# Patient Record
Sex: Female | Born: 1975 | Race: Black or African American | Hispanic: No | Marital: Single
Health system: Southern US, Community
[De-identification: ages and names within clinical notes are randomized; demographics above are authoritative.]

---

## 2010-08-08 ENCOUNTER — Emergency Department: Payer: Self-pay | Admitting: Emergency Medicine

## 2011-12-23 IMAGING — CR DG FOOT COMPLETE 3+V*L*
1 series · 3 of 3 positions shown · non-contrast
Comparison: none

REASON FOR EXAM: pain swelling s/p WC injury
COMMENTS:   LMP: Three weeks ago

PROCEDURE:     DXR - DXR FOOT LT COMP W/OBLIQUES  - August 08, 2010 [DATE]
RESULT:     Images of the left foot demonstrate no fracture, dislocation or
radiopaque foreign body.

[Series 1: view not recorded · 0.17mm/px · 3 of 3 slices shown]
[im 1/3]
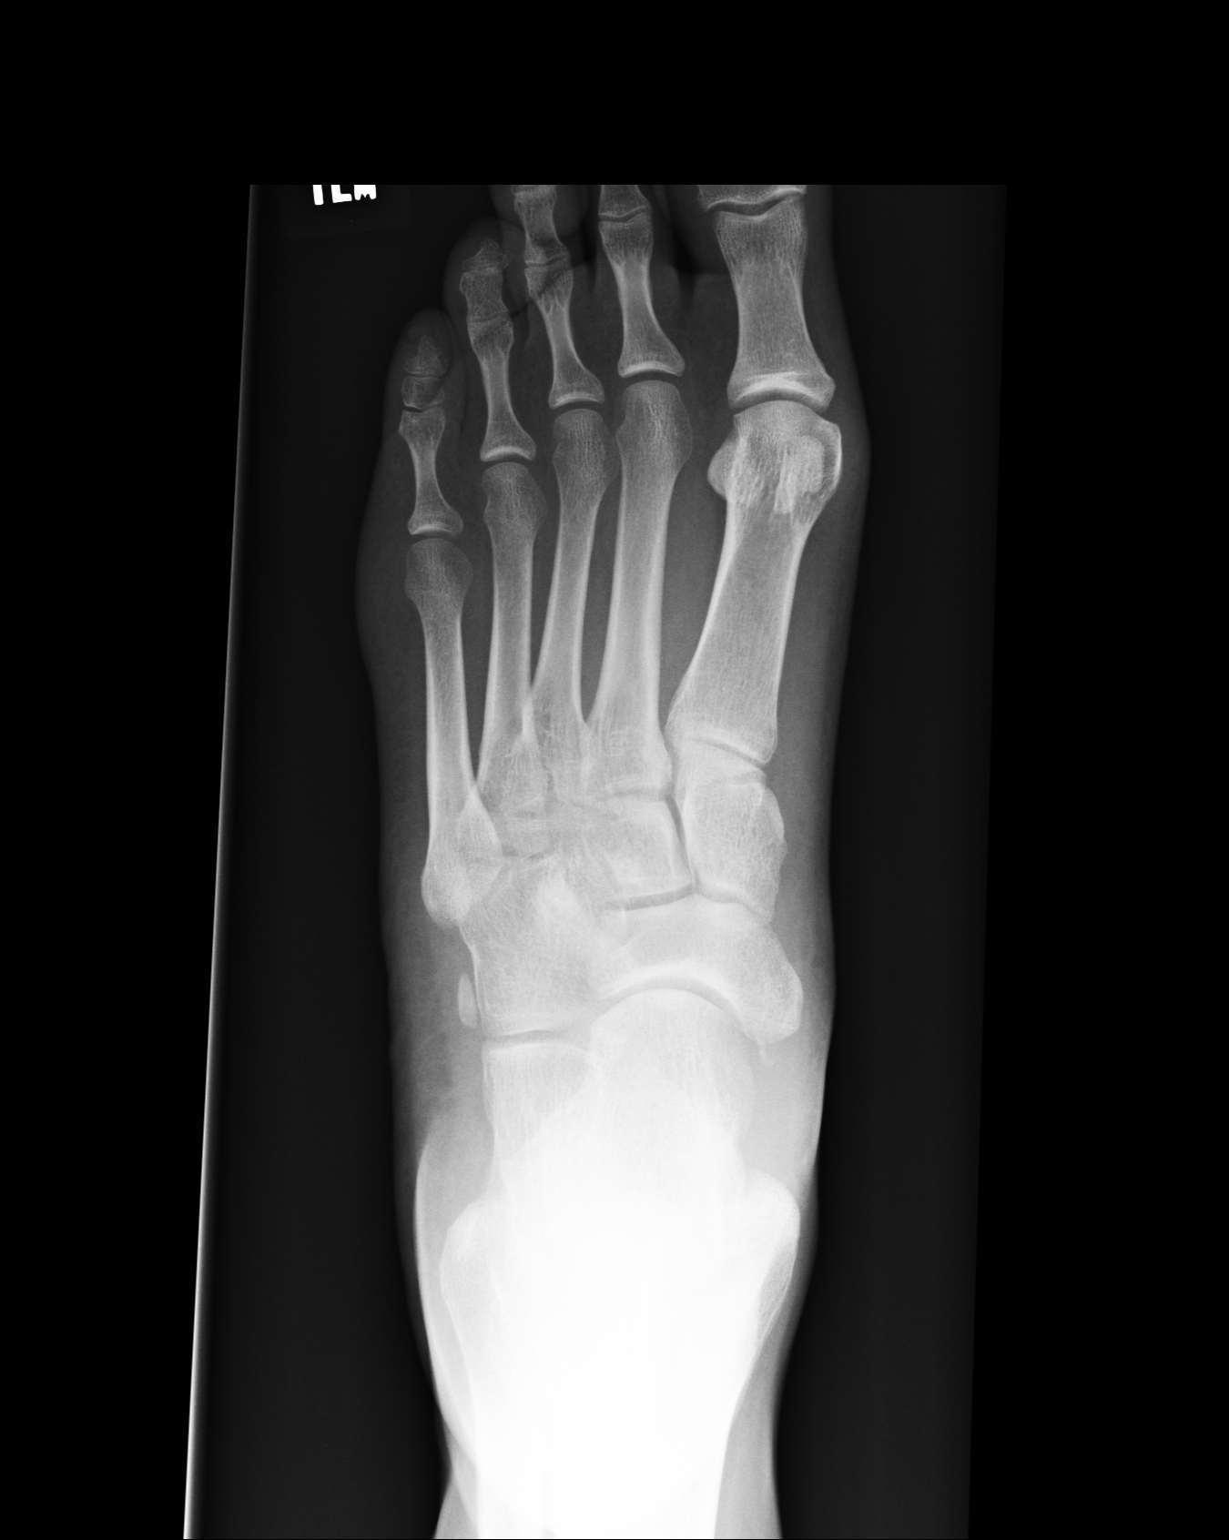
[im 2/3]
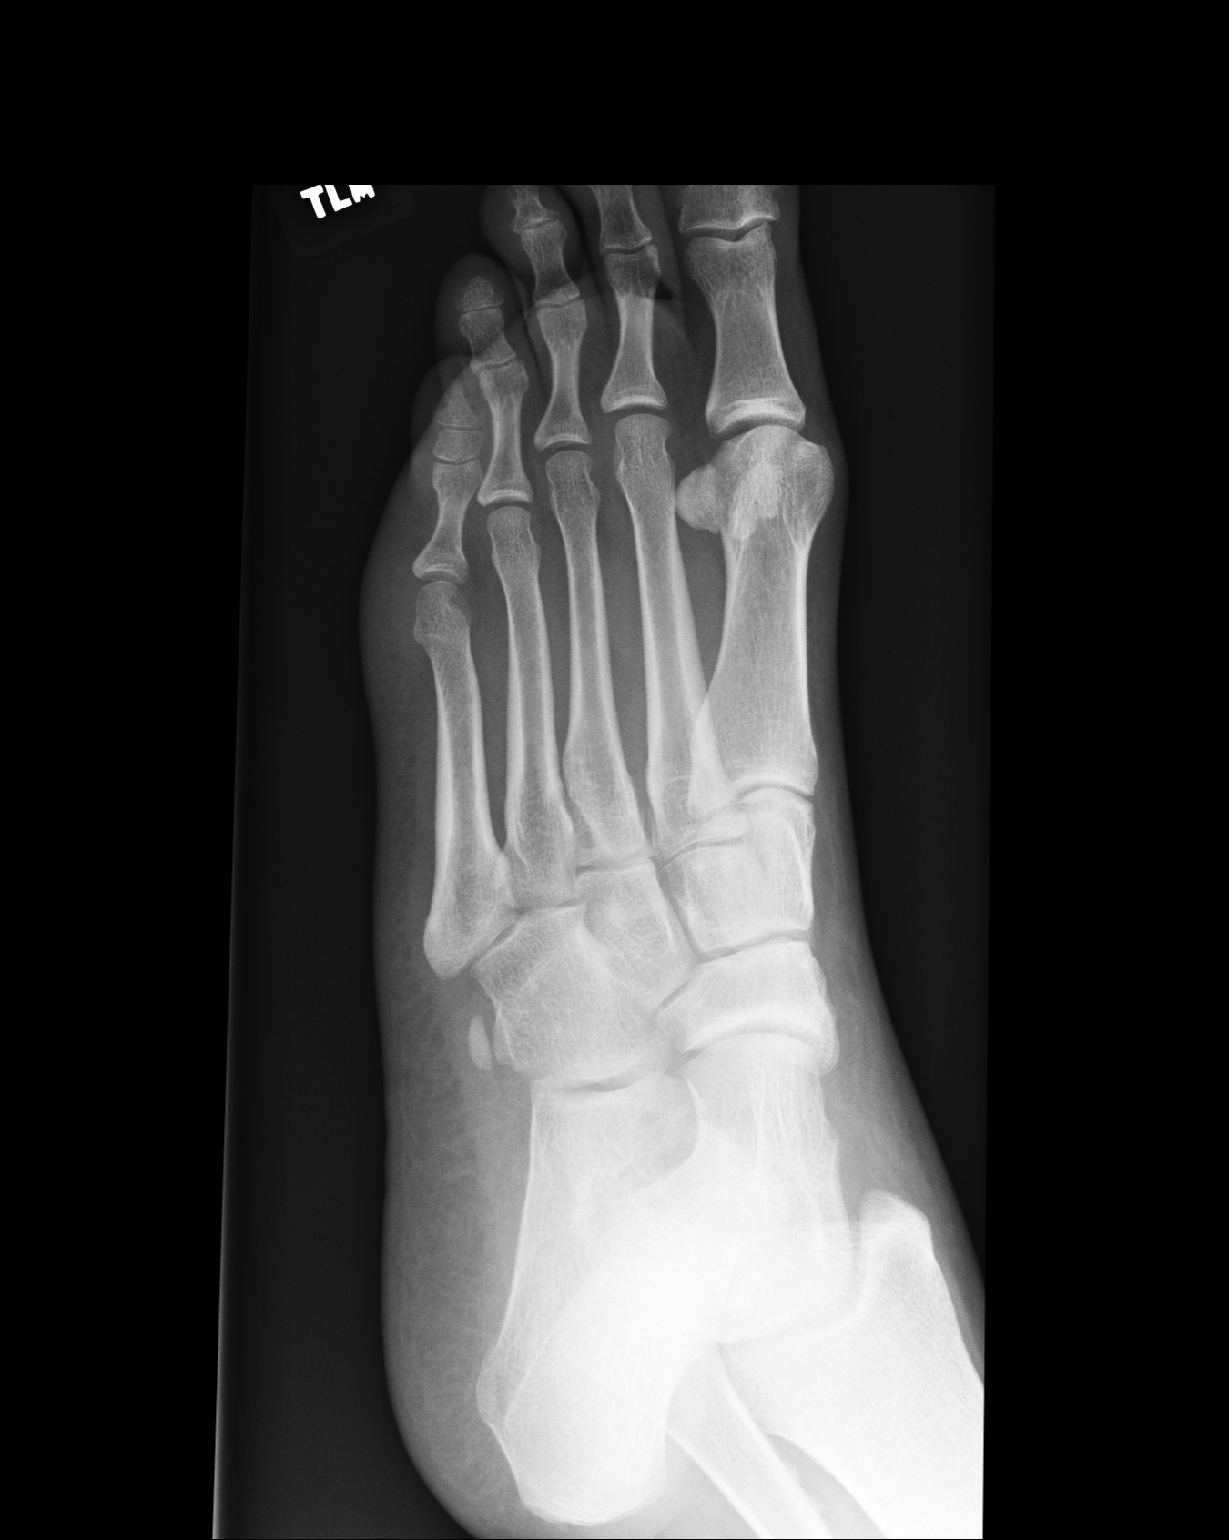
[im 3/3]
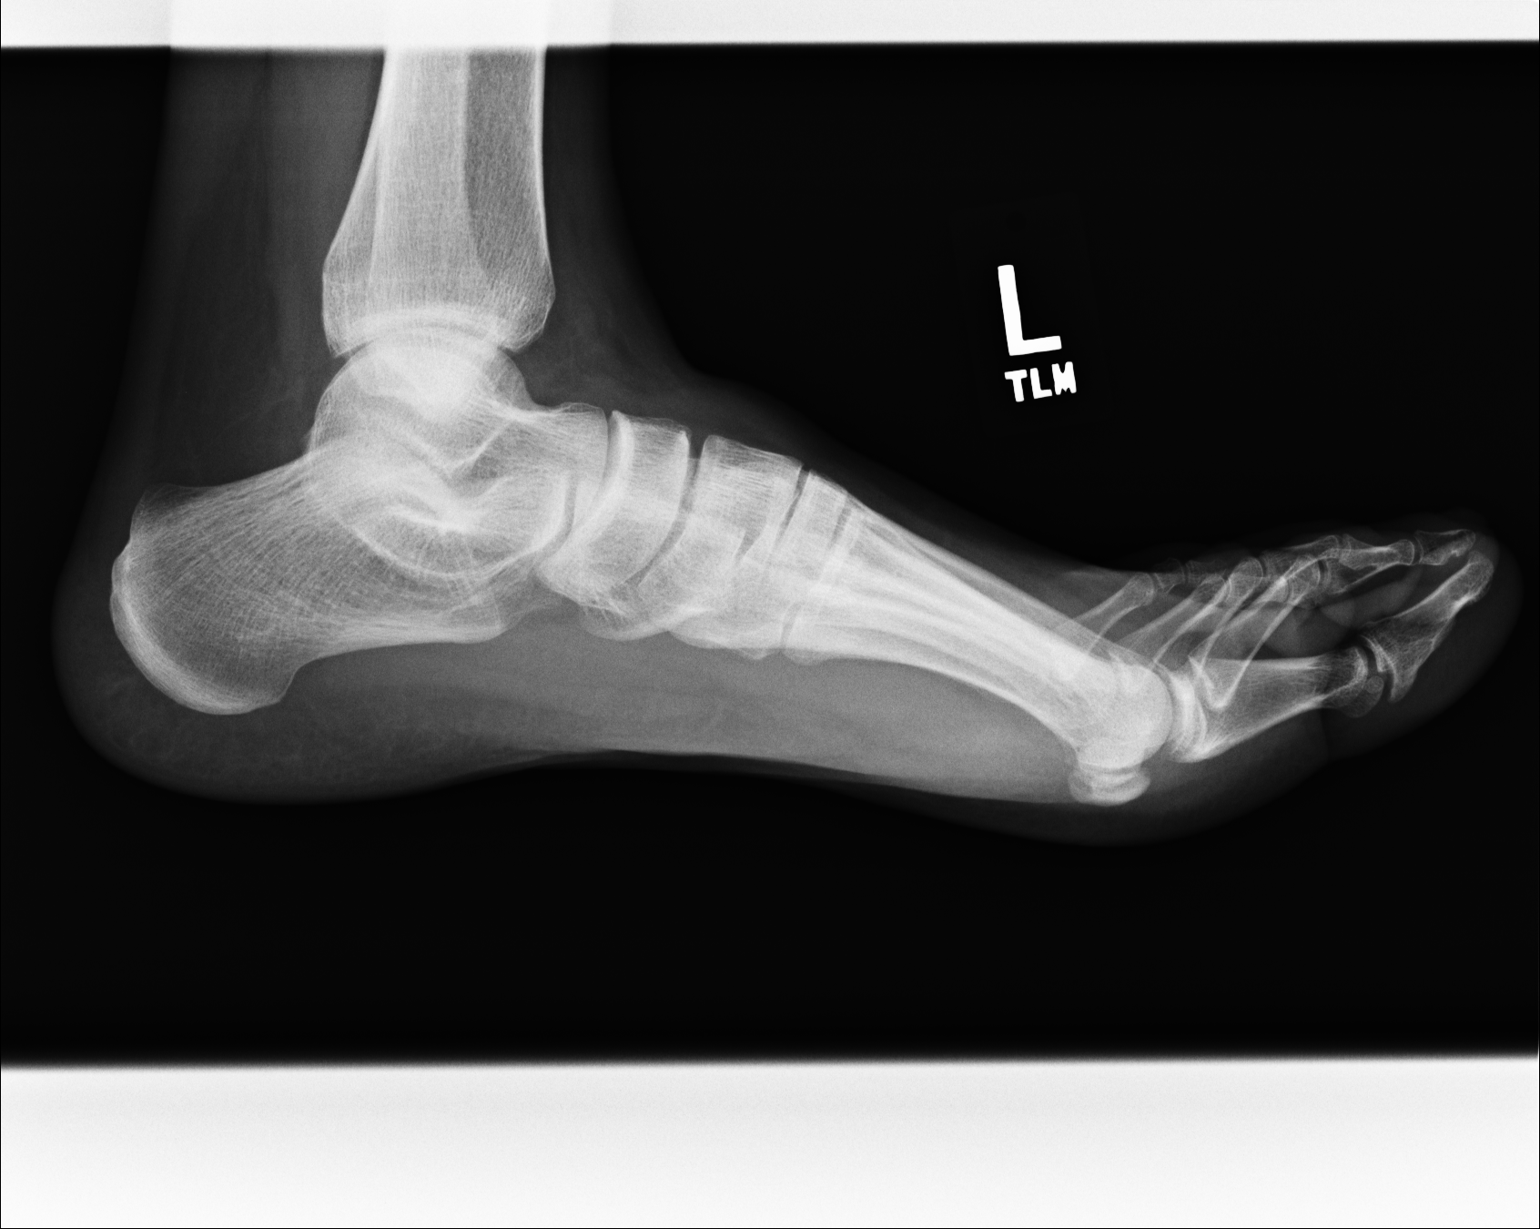

[3 of 3 positions shown; findings below may reference images not displayed]

IMPRESSION: Please see above.

## 2013-04-13 ENCOUNTER — Encounter: Payer: Self-pay | Admitting: Obstetrics and Gynecology

## 2013-04-17 ENCOUNTER — Encounter: Payer: Self-pay | Admitting: Obstetrics & Gynecology

## 2013-04-20 ENCOUNTER — Ambulatory Visit: Payer: Self-pay | Admitting: Obstetrics & Gynecology

## 2013-05-25 ENCOUNTER — Encounter: Payer: Self-pay | Admitting: Maternal & Fetal Medicine

## 2013-10-20 ENCOUNTER — Inpatient Hospital Stay: Payer: Self-pay | Admitting: Obstetrics and Gynecology

## 2013-10-20 LAB — CBC WITH DIFFERENTIAL/PLATELET
BASOS ABS: 0 10*3/uL (ref 0.0–0.1)
Basophil %: 0.5 %
Eosinophil #: 0 10*3/uL (ref 0.0–0.7)
Eosinophil %: 0.7 %
HCT: 39 % (ref 35.0–47.0)
HGB: 12.7 g/dL (ref 12.0–16.0)
Lymphocyte #: 1.5 10*3/uL (ref 1.0–3.6)
Lymphocyte %: 31.5 %
MCH: 26.9 pg (ref 26.0–34.0)
MCHC: 32.7 g/dL (ref 32.0–36.0)
MCV: 82 fL (ref 80–100)
Monocyte #: 0.5 x10 3/mm (ref 0.2–0.9)
Monocyte %: 10.2 %
NEUTROS ABS: 2.8 10*3/uL (ref 1.4–6.5)
Neutrophil %: 57.1 %
Platelet: 228 10*3/uL (ref 150–440)
RBC: 4.73 10*6/uL (ref 3.80–5.20)
RDW: 13.8 % (ref 11.5–14.5)
WBC: 4.9 10*3/uL (ref 3.6–11.0)

## 2013-10-22 LAB — HEMATOCRIT: HCT: 38.1 % (ref 35.0–47.0)

## 2014-08-28 IMAGING — US US OB NUCHAL TRANSLUCENCY 1ST GEST - [facility] NRPT
1 series · 14 of 28 positions shown · non-contrast
Comparison: none

[Series 1: us ob nuchal translucency 1st gest - [facility] nrpt · 0.28mm/px · 14 of 70 slices shown]
[im 3/70]
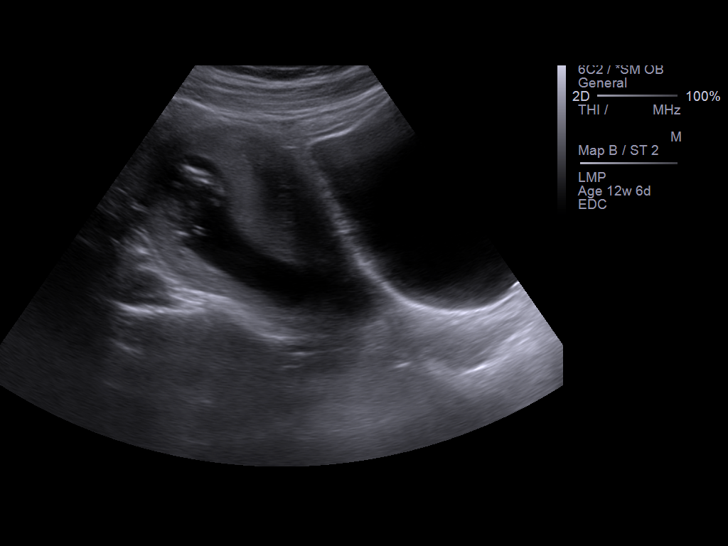
[im 8/70]
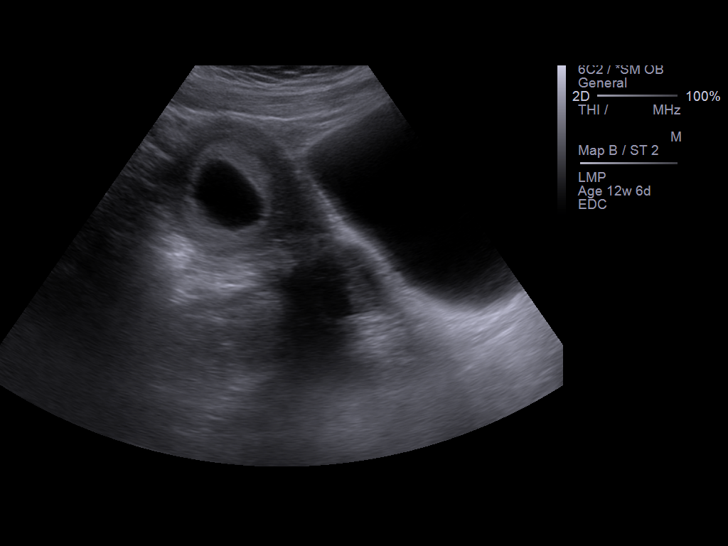
[im 13/70]
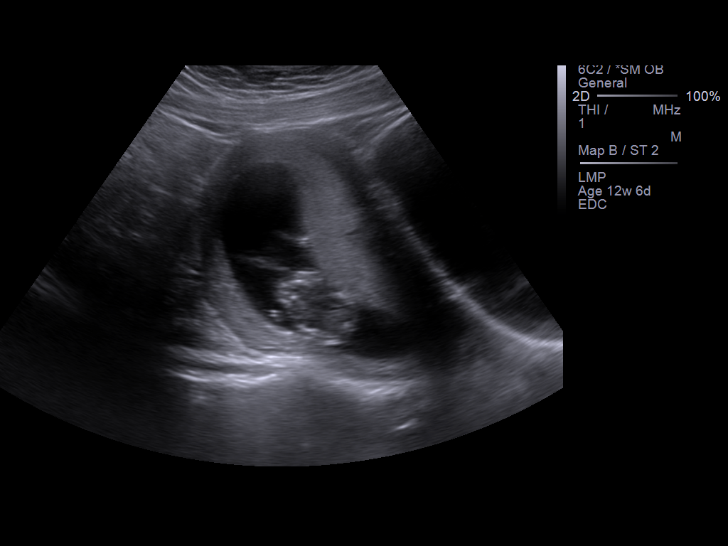
[im 18/70]
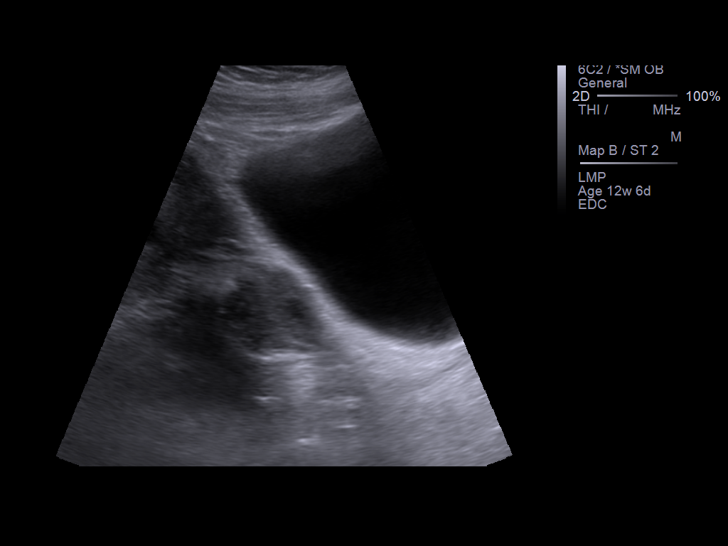
[im 24/70]
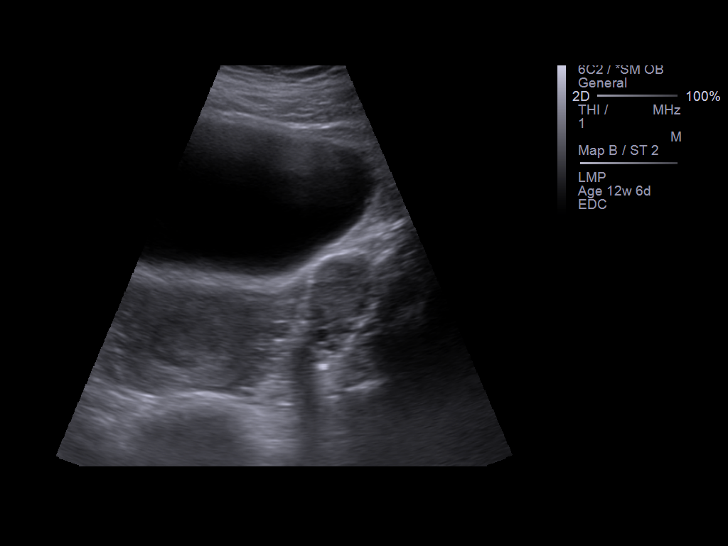
[im 29/70]
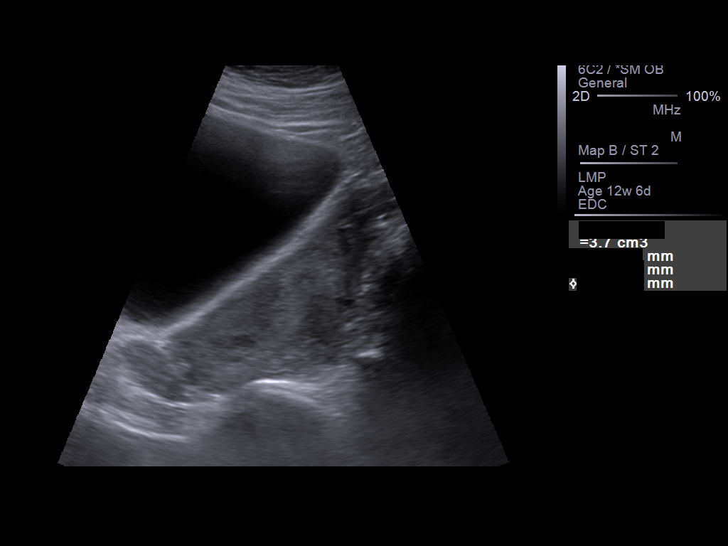
[im 34/70]
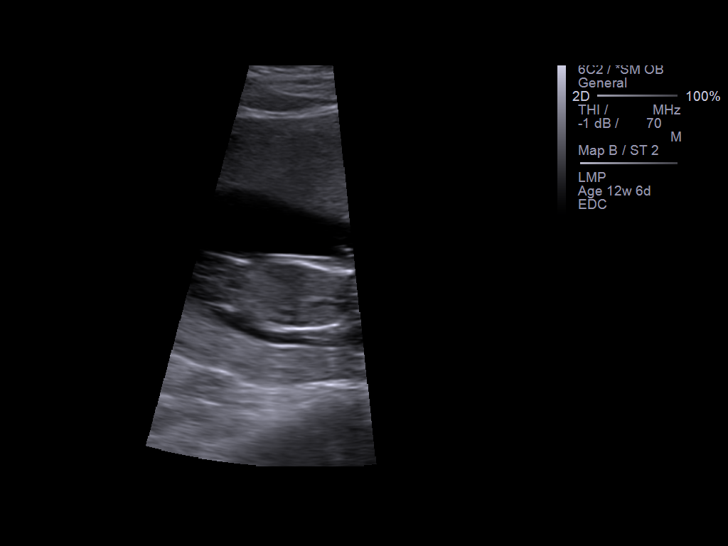
[im 39/70]
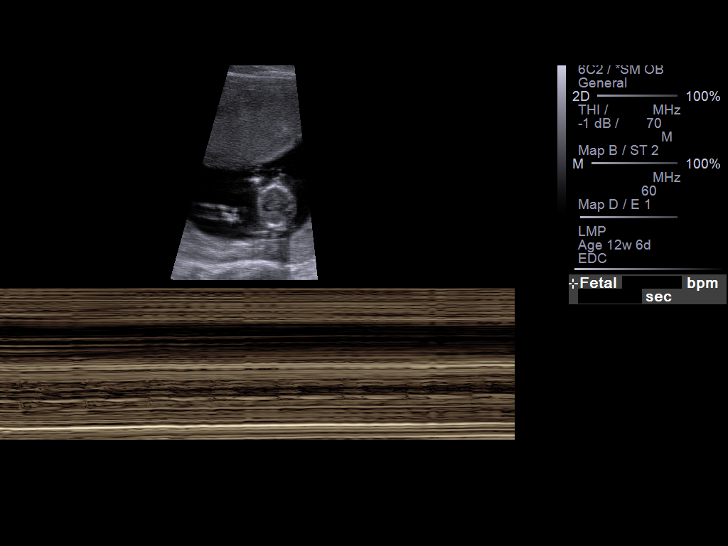
[im 44/70]
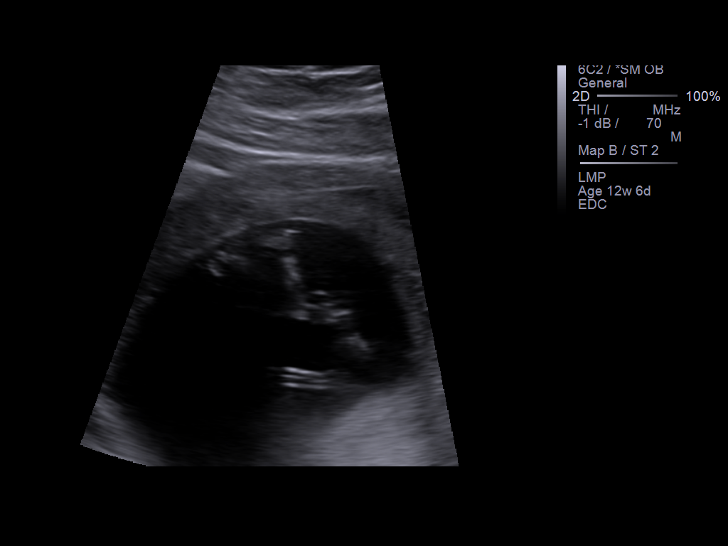
[im 49/70]
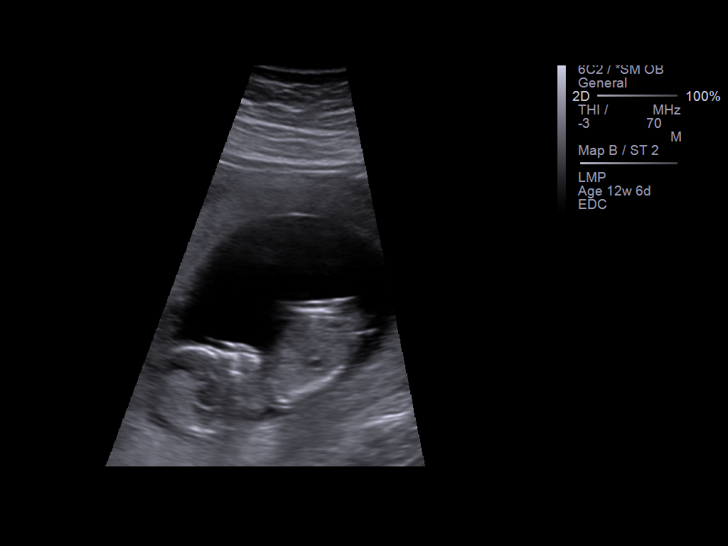
[im 54/70]
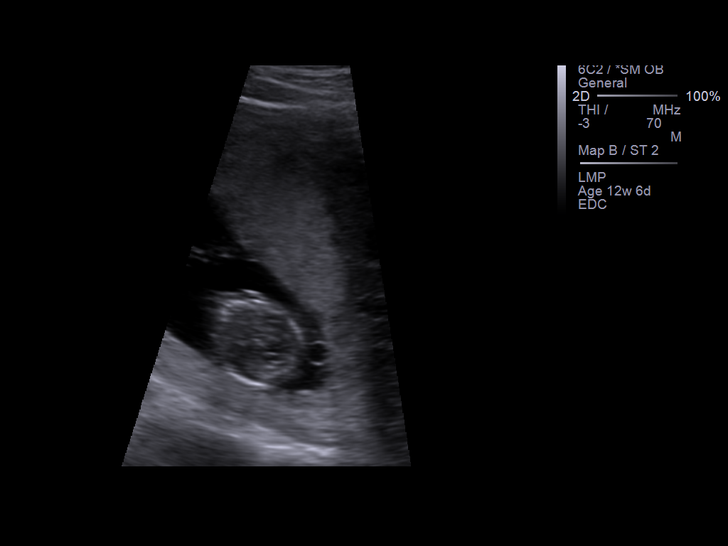
[im 59/70]
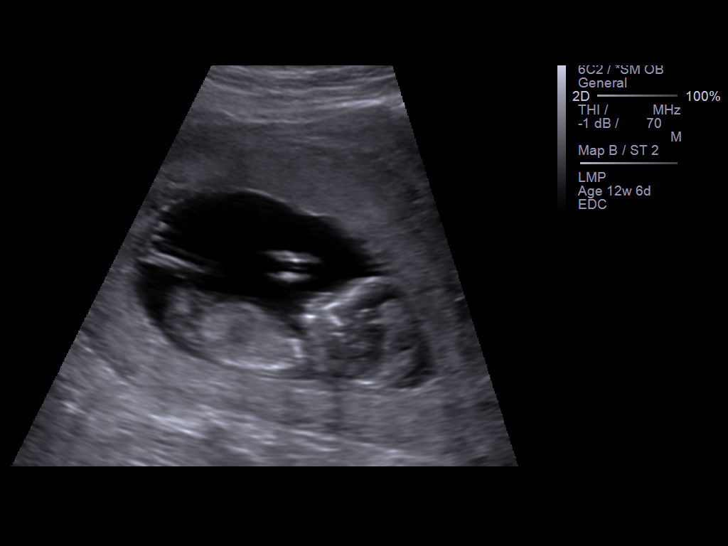
[im 64/70]
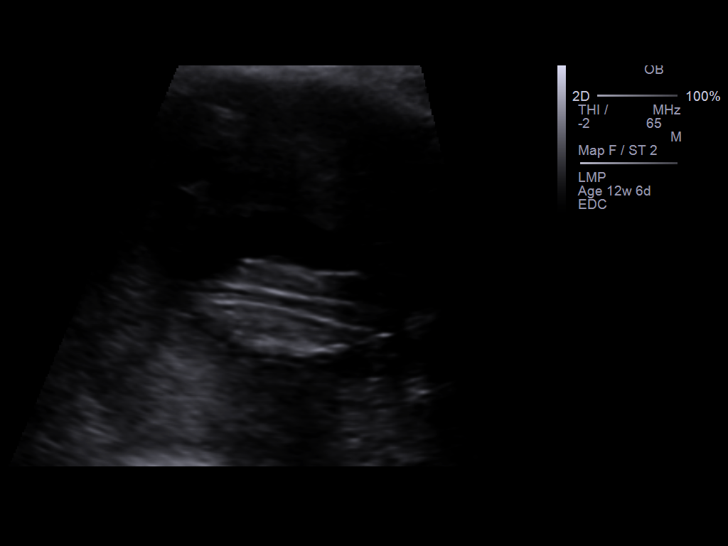
[im 70/70]
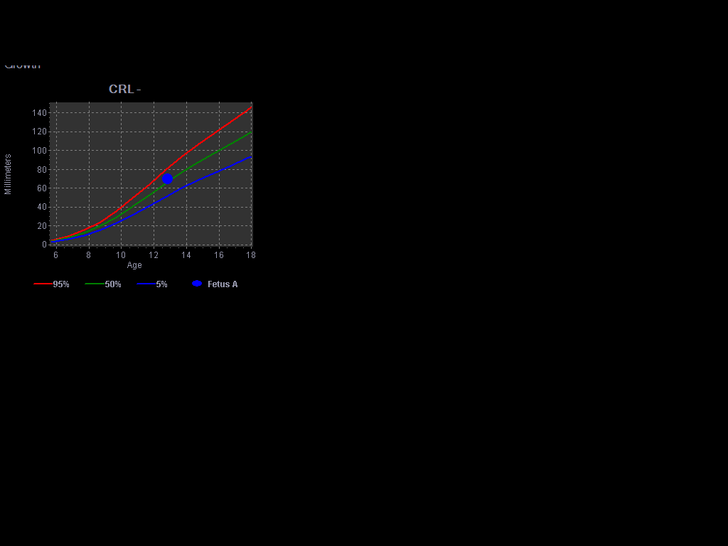

[14 of 28 positions shown; findings below may reference images not displayed]

IMAGES IMPORTED FROM THE [employer] WORKFLOW SYSTEM
NO DICTATION FOR STUDY

## 2014-09-01 IMAGING — US US OB NUCHAL TRANSLUCENCY 1ST GEST - [facility] NRPT
1 series · 14 of 24 positions shown · non-contrast
Comparison: none

[Series 1: us ob nuchal translucency 1st gest - [facility] nrpt · 0.21mm/px · 14 of 24 slices shown]
[im 1/24]
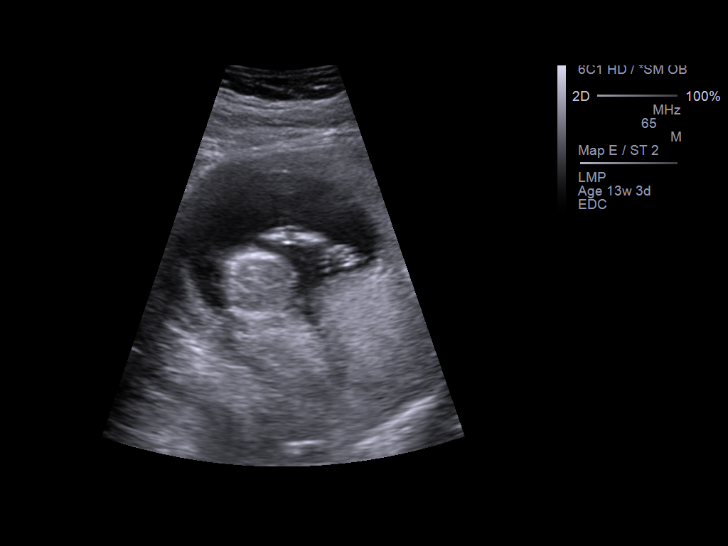
[im 3/24]
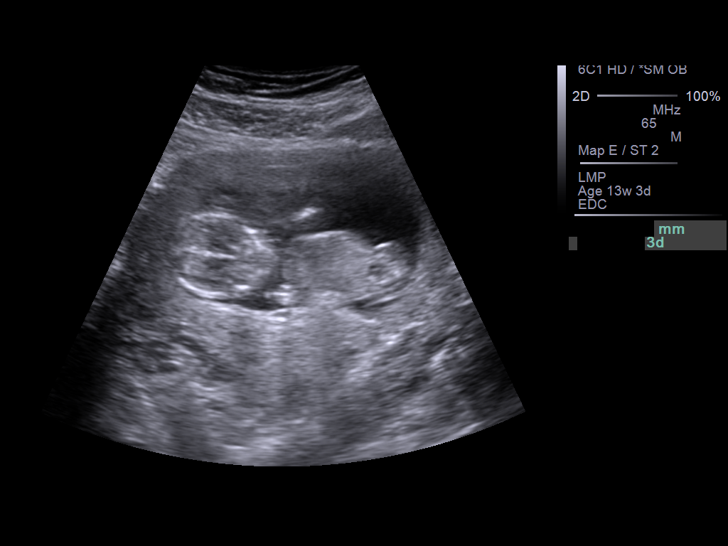
[im 5/24]
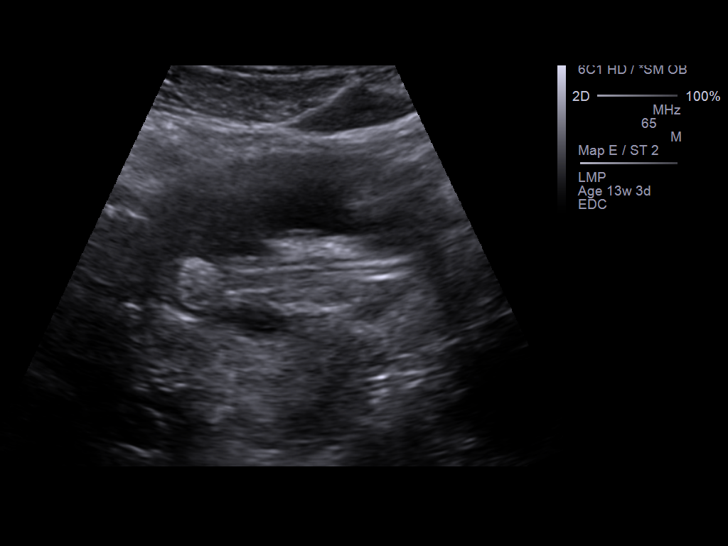
[im 7/24]
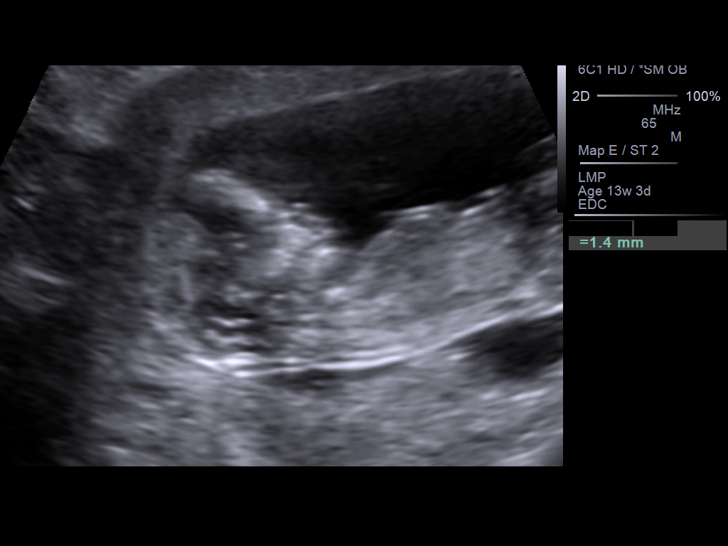
[im 8/24]
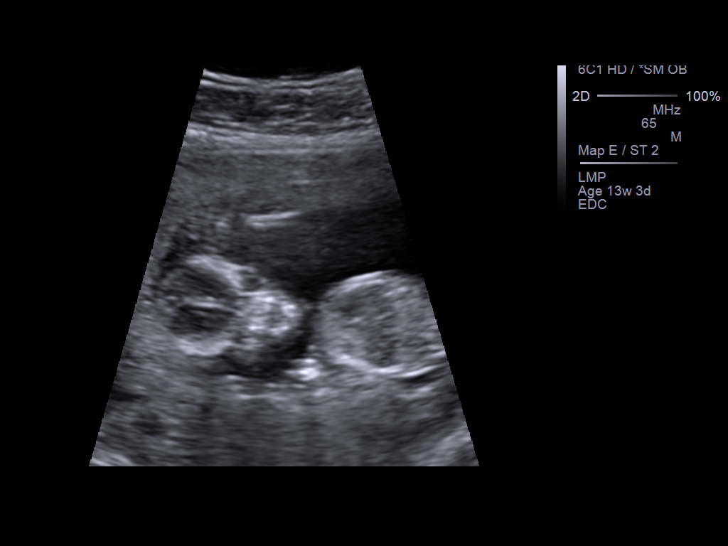
[im 10/24]
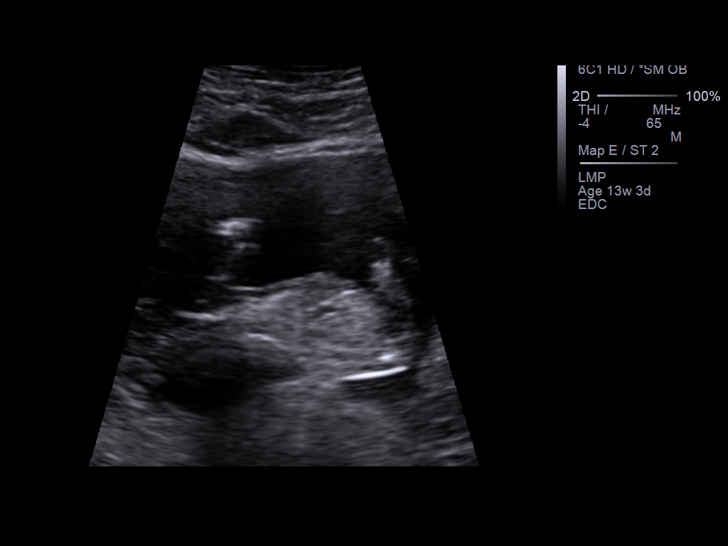
[im 12/24]
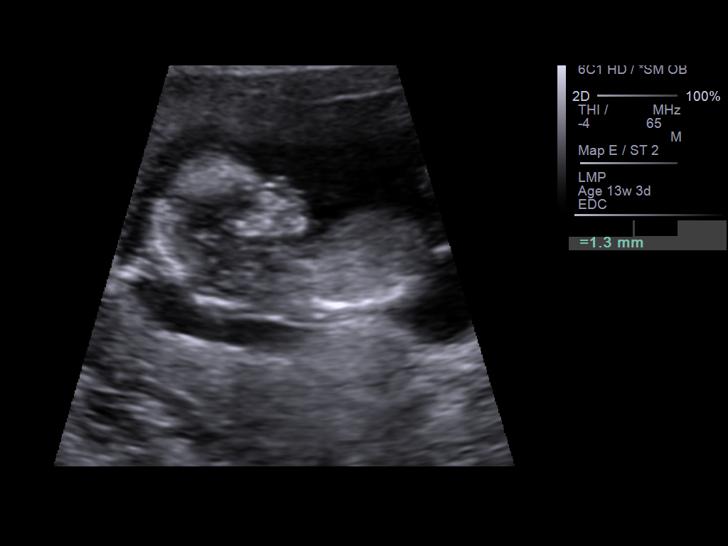
[im 13/24]
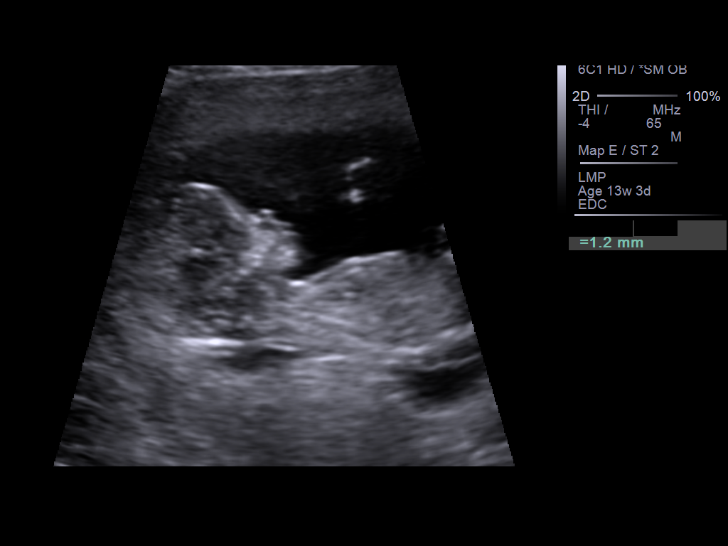
[im 15/24]
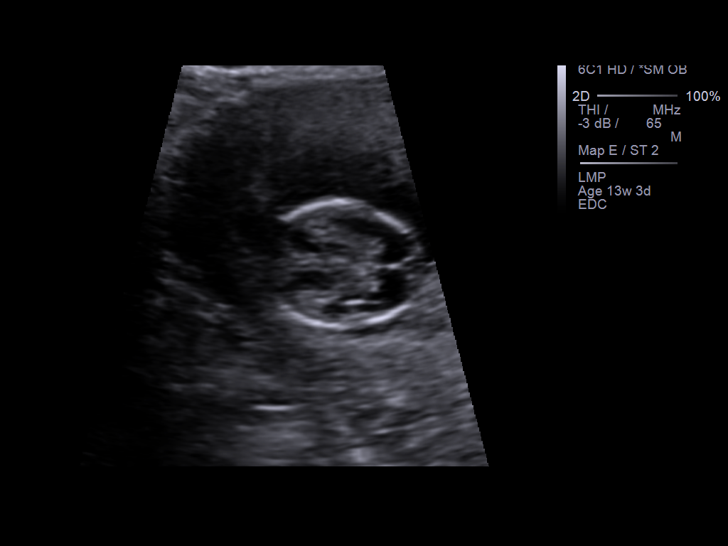
[im 17/24]
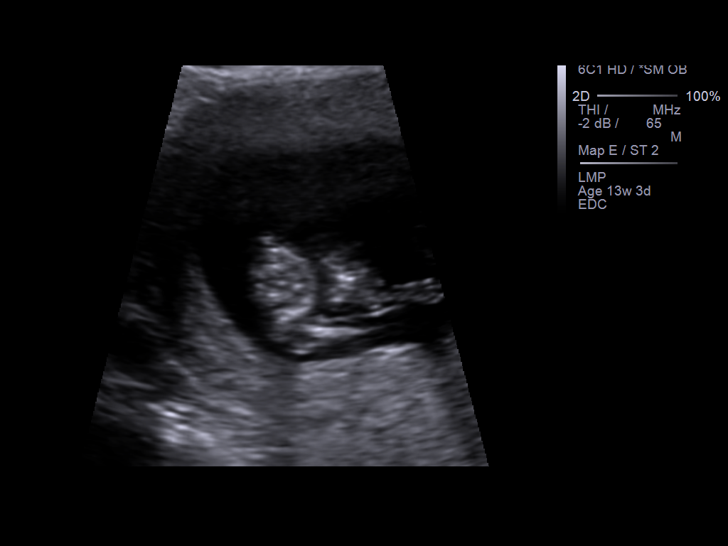
[im 19/24]
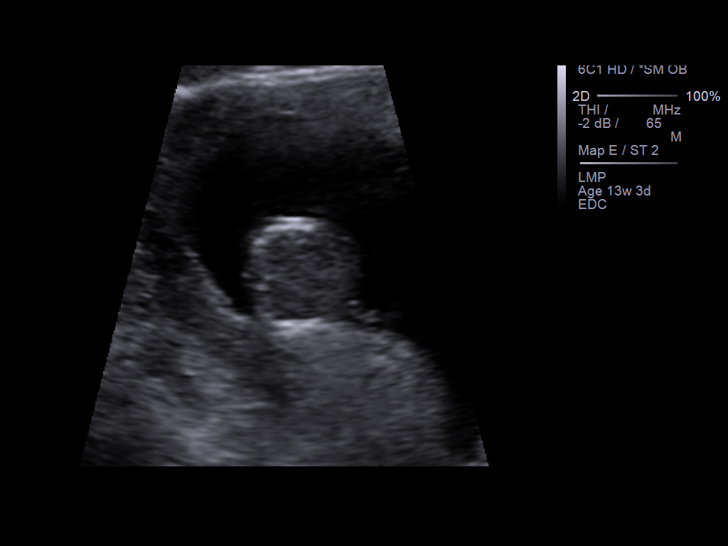
[im 20/24]
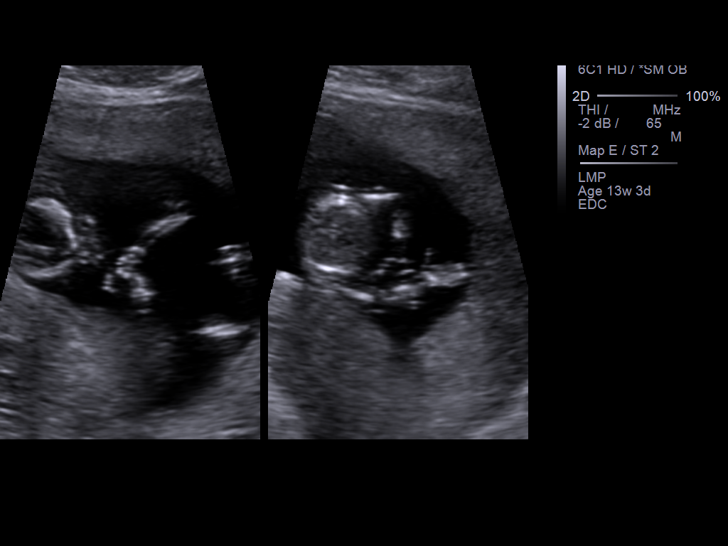
[im 22/24]
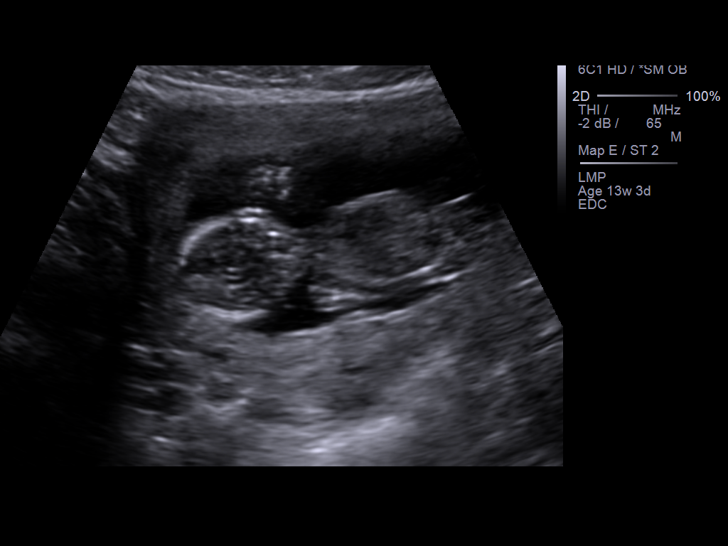
[im 24/24]
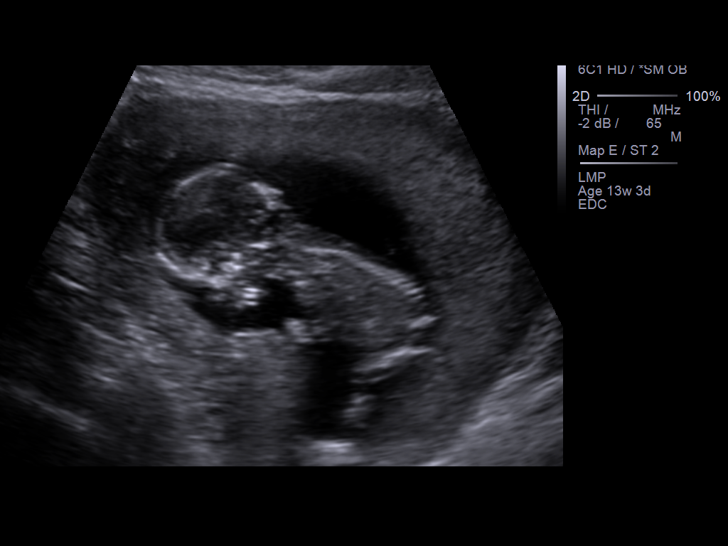

[14 of 24 positions shown; findings below may reference images not displayed]

IMAGES IMPORTED FROM THE [employer] WORKFLOW SYSTEM
NO DICTATION FOR STUDY

## 2014-10-09 IMAGING — US US OB DETAIL+14 WK - NRPT [facility]
1 series · 14 of 28 positions shown · non-contrast
Comparison: none

[Series 1: us ob detail+14 wk - nrpt [facility] · 0.23mm/px · 14 of 101 slices shown]
[im 4/101]
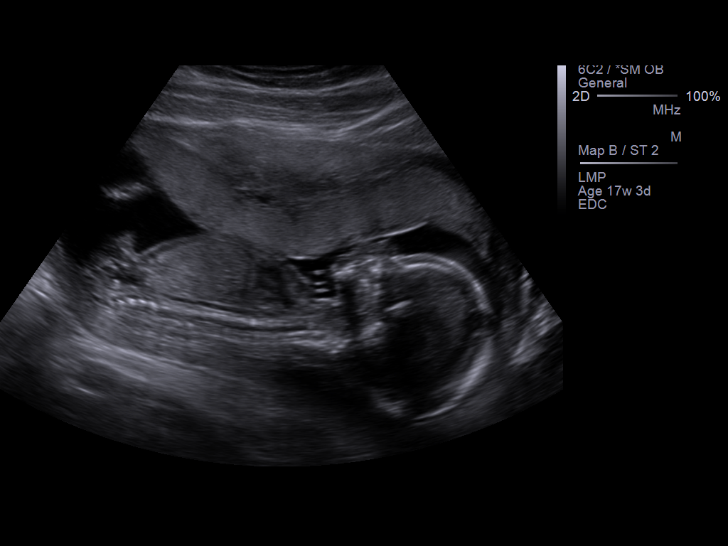
[im 12/101]
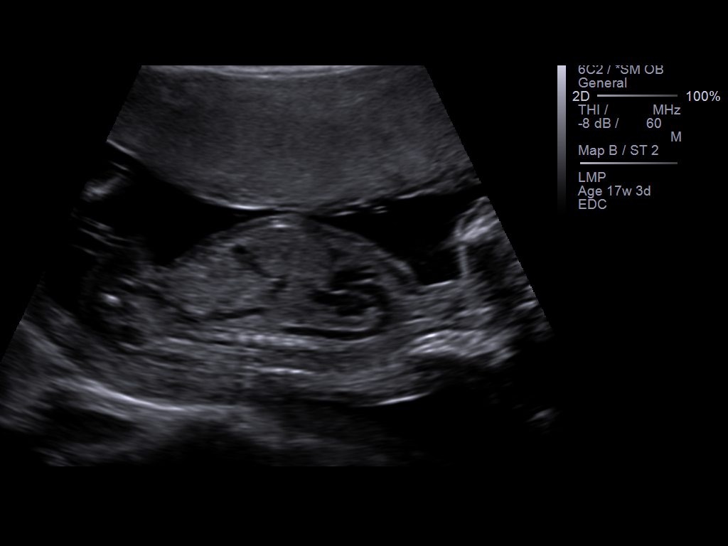
[im 19/101]
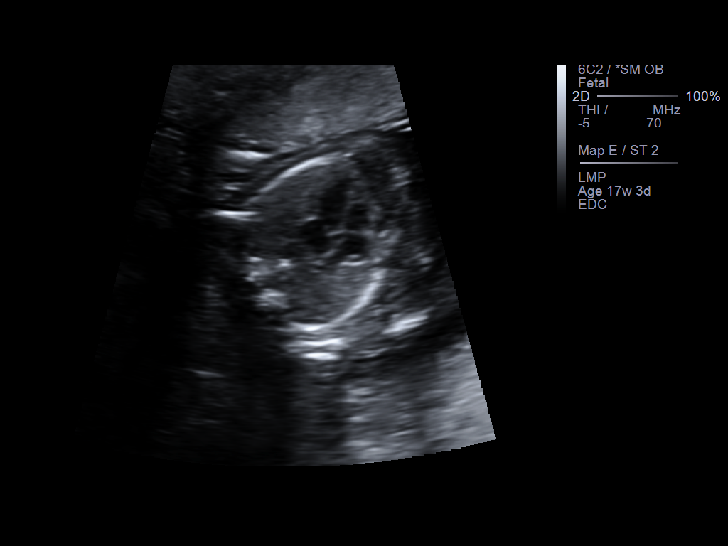
[im 26/101]
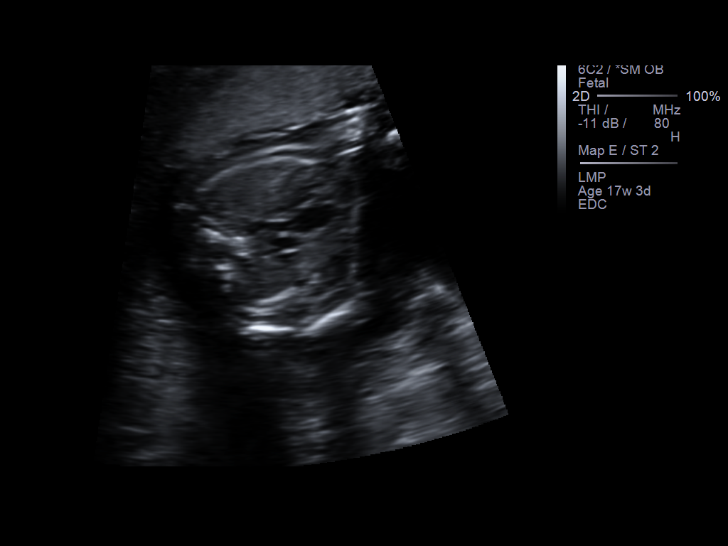
[im 34/101]
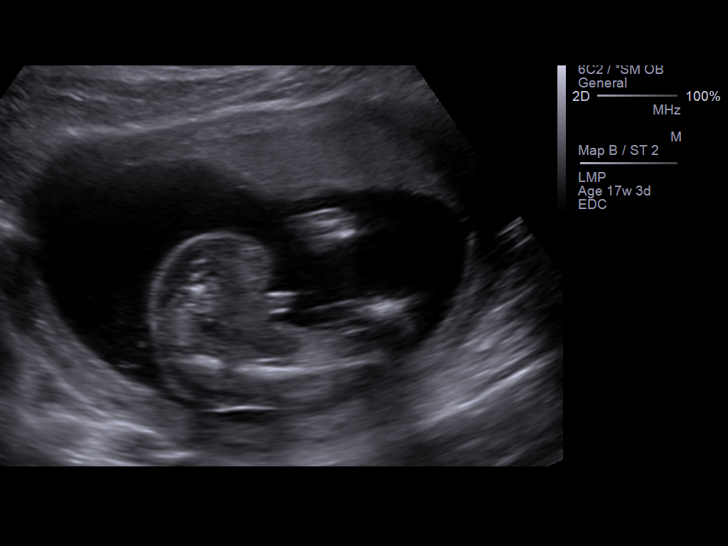
[im 41/101]
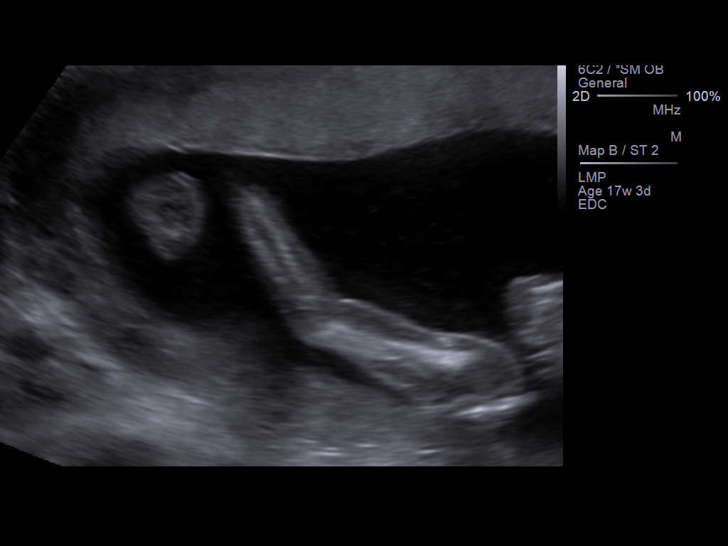
[im 49/101]
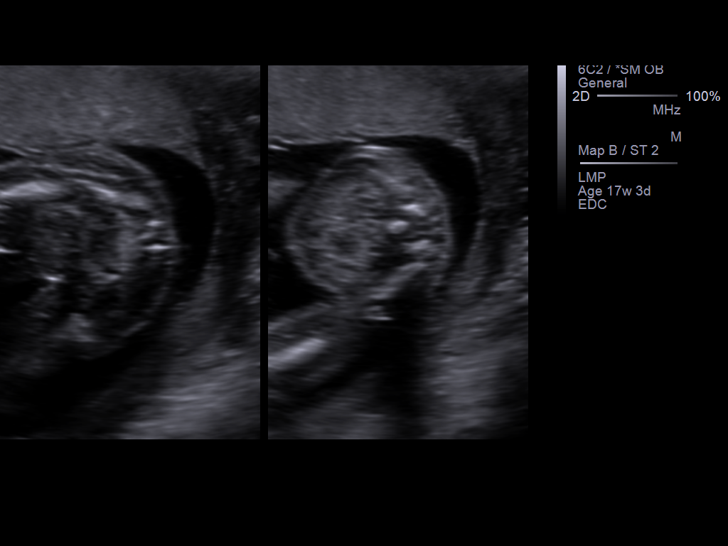
[im 56/101]
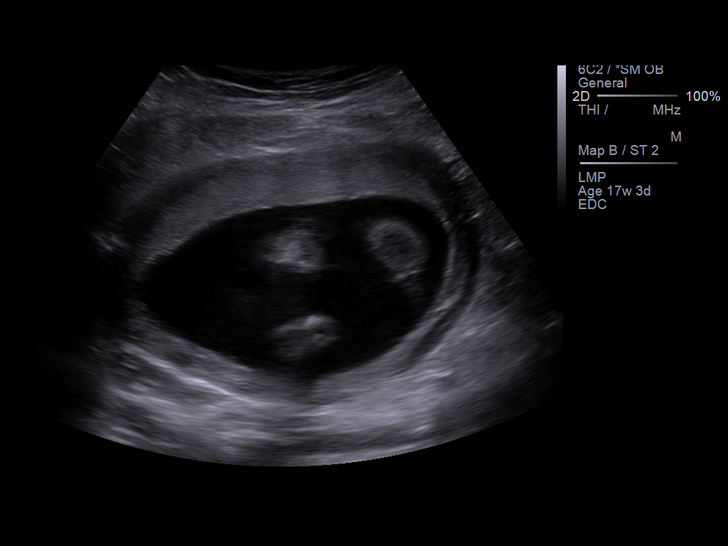
[im 63/101]
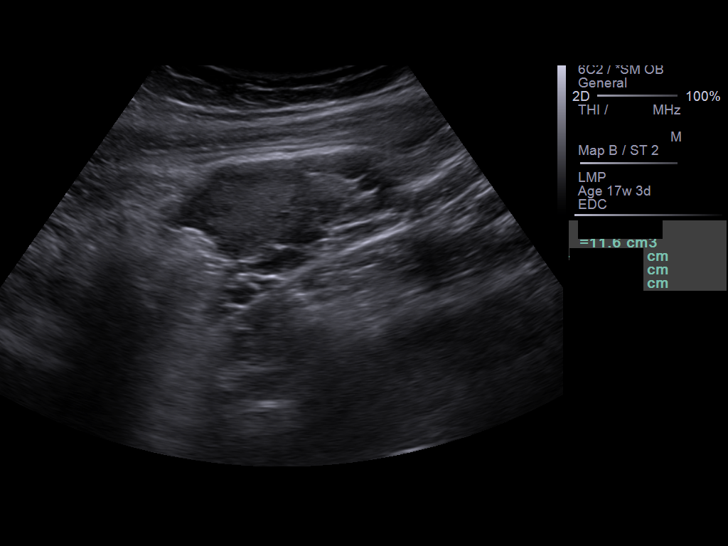
[im 71/101]
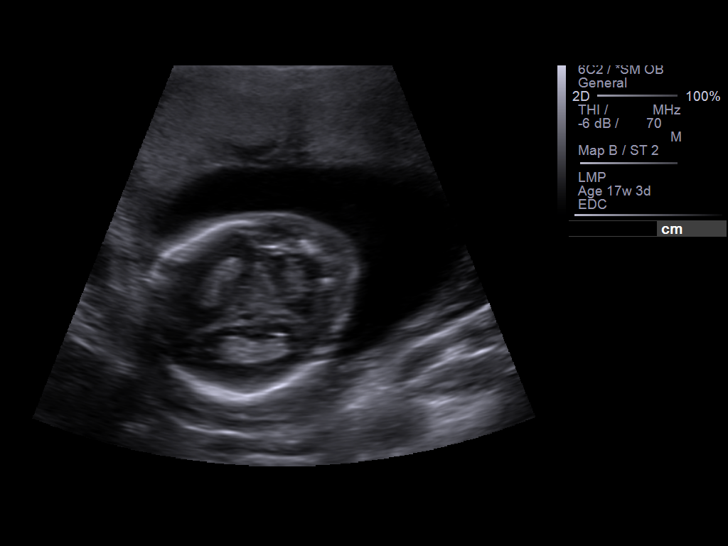
[im 78/101]
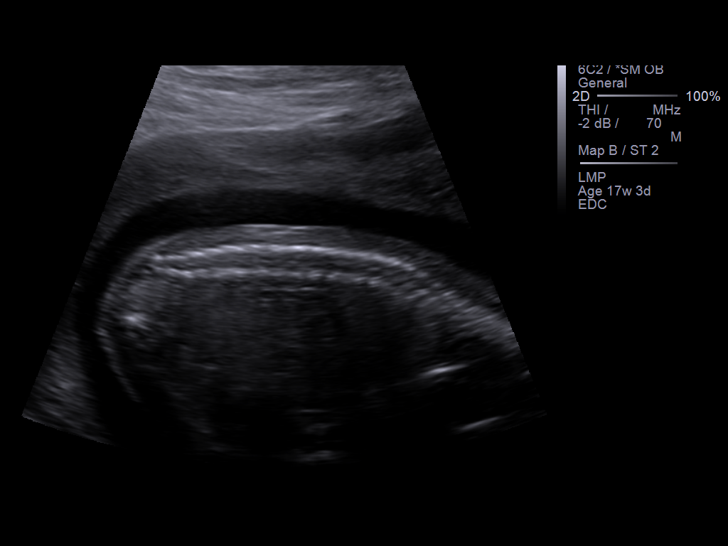
[im 86/101]
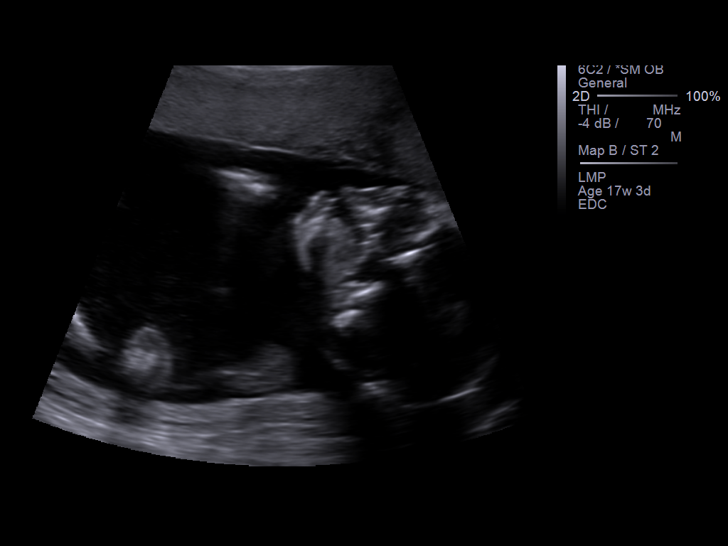
[im 93/101]
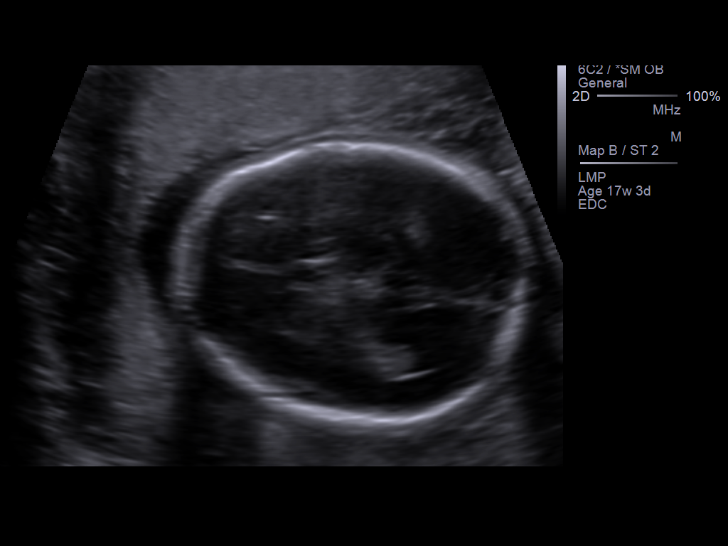
[im 101/101]
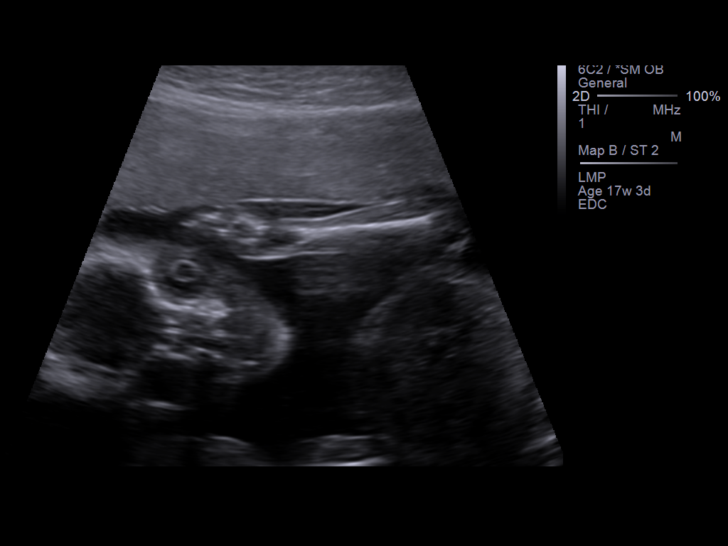

[14 of 28 positions shown; findings below may reference images not displayed]

IMAGES IMPORTED FROM THE [employer] WORKFLOW SYSTEM
NO DICTATION FOR STUDY

## 2015-02-05 NOTE — H&P (Signed)
L&D Evaluation:  History:  HPI 39 Y/O G1 @ 40wks EDC 10/20/13 here for IOL @ term. Care @ [facility]  AMA HX Anxiety-no meds, +AFP NL US [facility], GBS negative. Irregular mild uc's, denies leaking fluid or vaginal bleeding, baby is active.   Presents with above   Patient's Medical History No Chronic Illness   Patient's Surgical History none   Medications Pre Natal Vitamins   Allergies NKDA   Social History none   Family History Non-Contributory   ROS:  ROS All systems were reviewed.  HEENT, CNS, GI, GU, Respiratory, CV, Renal and Musculoskeletal systems were found to be normal.   Exam:  Vital Signs stable   Urine Protein not completed   General no apparent distress   Mental Status clear   Chest clear   Heart normal sinus rhythm   Abdomen gravid, non-tender   Estimated Fetal Weight Average for gestational age   Fetal Position vtx   Fundal Height term   Back no CVAT   Edema no edema   Reflexes 2+   Clonus negative   Pelvic no external lesions, cx post 2cm vtx @ -2 BOWI no show   Mebranes Intact   FHT normal rate with no decels, 140's 150's avg variability with accels   Fetal Heart Rate 144   Ucx irregular, mild   Skin dry   Lymph no lymphadenopathy   Impression:  Impression IOL @ term   Plan:  Plan see below   Comments Discussed risks, benefits of IOL @ term, pt wishes to proceed. Explained what to expect with labor progress including pain medicine options. Family supportive at bedside. Cervidil inserted, plan to rest overnight and IOL in am.   Electronic Signatures: Albertina ParrLugiano, Temara Lanum B (CNM)  (Signed 23-Jan-15 22:24)  Authored: L&D Evaluation   Last Updated: 23-Jan-15 22:24 by Albertina ParrLugiano, Eyob Godlewski B (CNM)

## 2019-12-28 ENCOUNTER — Ambulatory Visit: Payer: Self-pay | Attending: Internal Medicine

## 2019-12-28 DIAGNOSIS — Z23 Encounter for immunization: Secondary | ICD-10-CM

## 2019-12-28 NOTE — Progress Notes (Signed)
   Covid-19 Vaccination Clinic  Name:  Eileen Leon    MRN: [id number] DOB: Jun 23, 1976  12/28/2019  Ms. Eileen Leon was observed post Covid-19 immunization for 15 minutes without incident. She was provided with Vaccine Information Sheet and instruction to access the V-Safe system.   Ms. Eileen Leon was instructed to call 911 with any severe reactions post vaccine: Marland Kitchen Difficulty breathing  . Swelling of face and throat  . A fast heartbeat  . A bad rash all over body  . Dizziness and weakness   Immunizations Administered    Name Date Dose VIS Date Route   Pfizer COVID-19 Vaccine 12/28/2019  5:02 PM 0.3 mL 09/08/2019 Intramuscular   Manufacturer: [employer], [employer]   Lot: [id number]   NDC: [address]-8

## 2020-01-05 ENCOUNTER — Ambulatory Visit: Payer: Self-pay

## 2020-01-21 ENCOUNTER — Ambulatory Visit: Payer: Self-pay | Attending: Internal Medicine

## 2020-01-21 DIAGNOSIS — Z23 Encounter for immunization: Secondary | ICD-10-CM

## 2020-01-21 NOTE — Progress Notes (Signed)
   Covid-19 Vaccination Clinic  Name:  Eileen Leon    MRN: [id number] DOB: Dec 24, 1975  01/21/2020  Eileen Leon was observed post Covid-19 immunization for 15 minutes without incident. She was provided with Vaccine Information Sheet and instruction to access the V-Safe system.   Eileen Leon was instructed to call 911 with any severe reactions post vaccine: Marland Kitchen Difficulty breathing  . Swelling of face and throat  . A fast heartbeat  . A bad rash all over body  . Dizziness and weakness   Immunizations Administered    Name Date Dose VIS Date Route   Pfizer COVID-19 Vaccine 01/21/2020  4:16 PM 0.3 mL 11/22/2018 Intramuscular   Manufacturer: [employer], [employer]   Lot: [id number]   NDC: [address]-7
